# Patient Record
Sex: Male | Born: 1965 | Race: Black or African American | Hispanic: No | Marital: Single | Smoking: Current every day smoker
Health system: Southern US, Community
[De-identification: ages and names within clinical notes are randomized; demographics above are authoritative.]

---

## 2014-10-07 ENCOUNTER — Encounter (HOSPITAL_COMMUNITY): Payer: Self-pay | Admitting: Emergency Medicine

## 2014-10-07 ENCOUNTER — Emergency Department (HOSPITAL_COMMUNITY)
Admission: EM | Admit: 2014-10-07 | Discharge: 2014-10-07 | Disposition: A | Discharge status: Home / Self Care | Payer: Self-pay | Attending: Emergency Medicine | Admitting: Emergency Medicine

## 2014-10-07 DIAGNOSIS — N342 Other urethritis: Secondary | ICD-10-CM | POA: Insufficient documentation

## 2014-10-07 DIAGNOSIS — Z720 Tobacco use: Secondary | ICD-10-CM | POA: Insufficient documentation

## 2014-10-07 MED ORDER — AZITHROMYCIN 250 MG PO TABS
1000.0000 mg | ORAL_TABLET | Freq: Once | ORAL | Status: AC
  Administered 2014-10-07: 1000 mg via ORAL
  Filled 2014-10-07: qty 4

## 2014-10-07 MED ORDER — CEFTRIAXONE SODIUM 250 MG IJ SOLR
250.0000 mg | Freq: Once | INTRAMUSCULAR | Status: AC
  Administered 2014-10-07: 250 mg via INTRAMUSCULAR
  Filled 2014-10-07: qty 250

## 2014-10-07 MED ORDER — STERILE WATER FOR INJECTION IJ SOLN
1.0000 mL | Freq: Once | INTRAMUSCULAR | Status: AC
  Administered 2014-10-07: 1 mL via INTRAMUSCULAR

## 2014-10-07 MED ORDER — STERILE WATER FOR INJECTION IJ SOLN
INTRAMUSCULAR | Status: AC
  Administered 2014-10-07: 1 mL via INTRAMUSCULAR
  Filled 2014-10-07: qty 10

## 2014-10-07 NOTE — ED Notes (Signed)
Pt from home for eval of yellow discharge and dysuria that started after having intercourse with a woman on Saturday. Pt denies any n/v/d or abd pain. nad noted.

## 2014-10-07 NOTE — ED Provider Notes (Signed)
CSN: [id number]     Arrival date & time 10/07/14  1706 History  This chart was scribed for Steve Lozano, working with Mirian Mo, MD by Chestine Spore, ED Scribe. The patient was seen in room TR02C/TR02C at 5:49 PM.    Chief Complaint  Patient presents with  . Penile Discharge    The history is provided by the patient. No language interpreter was used.    Steve Lozano is a 49 y.o. male who presents to the Emergency Department complaining of yellow penile discharge onset yesterday. He reports that he last had unprotected intercourse with a woman 4 days ago. He notes that he was recently tested for HIV and the results returned negative. He denies medical hx of STD. He states that she is having associated symptoms of dysuria x 1 day. He states that he has not tried any medications for the relief of his symptoms. He denies penile pain/swelling, testicular pain/swelling, abdominal pain, n/v, sore throat, genital lesions, and any other symptoms. Denies allergies to any medications.    History reviewed. No pertinent past medical history. History reviewed. No pertinent past surgical history. No family history on file. Social History  Substance Use Topics  . Smoking status: Current Every Day Smoker -- 0.50 packs/day    Types: Cigarettes  . Smokeless tobacco: None  . Alcohol Use: No    Review of Systems  Constitutional: Negative for fever.  HENT: Negative for sore throat.   Eyes: Negative for discharge.  Gastrointestinal: Negative for nausea, vomiting, abdominal pain and rectal pain.  Genitourinary: Positive for dysuria and discharge (yellow). Negative for frequency, hematuria, penile swelling, scrotal swelling, difficulty urinating, genital sores, penile pain and testicular pain.  Musculoskeletal: Negative for arthralgias.  Skin: Negative for color change and rash.  Hematological: Negative for adenopathy.  Psychiatric/Behavioral: Negative for hallucinations.     Allergies  Review of  patient's allergies indicates no known allergies.  Home Medications   Prior to Admission medications   Not on File   BP 128/88 mmHg  Pulse 81  Temp(Src) 98 F (36.7 C) (Oral)  Ht  (1.727 m)  Wt 160 lb (72.576 kg)  BMI 24.33 kg/m2  SpO2 100%   Physical Exam  Constitutional: He appears well-developed and well-nourished.  HENT:  Head: Normocephalic and atraumatic.  Eyes: Conjunctivae are normal.  Neck: Normal range of motion. Neck supple.  Pulmonary/Chest: No respiratory distress.  Genitourinary: Uncircumcised. Penile tenderness present. Discharge (Copious yellow discharge) found.  Neurological: He is alert.  Skin: Skin is warm and dry.  Psychiatric: He has a normal mood and affect.  Nursing note and vitals reviewed.   ED Course  Procedures (including critical care time) DIAGNOSTIC STUDIES: Oxygen Saturation is 100% on RA, nl by my interpretation.    COORDINATION OF CARE: 5:54 PM Discussed treatment plan with pt at bedside which includes GC/Chylamydia probe, rocephin and Zithromax, HIV antibody, RPR and pt agreed to plan.   Labs Review Labs Reviewed - No data to display  Imaging Review No results found. I have personally reviewed and evaluated these lab results as part of my medical decision-making.   EKG Interpretation None      Vital signs reviewed and are as follows: Filed Vitals:   10/07/14 1728  BP: 128/88  Pulse: 81  Temp: 98 F (36.7 C)   Patient counseled on safe sexual practices. Told them that they should not have sexual contact for next 7 days and that they need to inform sexual partners so that  they can get tested and treated as well. Urged f/u with [place] [facility] for HIV and syphilis testing.  Patient verbalizes understanding and agrees with plan.    MDM   Final diagnoses:  Urethritis   Patient with likely gonococcal urethritis area testing and treatment as above. Do not suspect other complicating factors.  I personally  performed the services described in this documentation, which was scribed in my presence. The recorded information has been reviewed and is accurate.    Renne Crigler, PA-C 10/07/14 1840  Mirian Mo, MD 10/07/14 [phone]

## 2014-10-07 NOTE — Discharge Instructions (Signed)
Please read and follow all provided instructions.  Your diagnoses today include:  1. Urethritis     Tests performed today include:  Test for gonorrhea and chlamydia. You will be notified by telephone if you have a positive result.  Vital signs. See below for your results today.   Medications:  You were treated for chlamydia (1 gram azithromycin pills) and gonorrhea (  rocephin shot).  Home care instructions:  Read educational materials contained in this packet and follow any instructions provided.   You should tell your partners about your infection and avoid having sex for one week to allow time for the medicine to work.  STD Testing:  [facility] Department of [facility] [place], [facility]  [address], [place], phone [phone] or [phone]    Monday - Friday, call for an appointment  [facility] Department of [facility], [facility]  [address], [place], phone [phone] or [phone]   Monday - Friday, call for an appointment  Return instructions:   Please return to the Emergency Department if you experience worsening symptoms.   Please return if you have any other emergent concerns.  Additional Information:  Your vital signs today were: BP 128/88 mmHg   Pulse 81   Temp(Src) 98 F (36.7 C) (Oral)   Ht  (1.727 m)   Wt 160 lb (72.576 kg)   BMI 24.33 kg/m2   SpO2 100% If your blood pressure (BP) was elevated above 135/85 this visit, please have this repeated by your doctor within one month. --------------

## 2014-10-08 LAB — GC/CHLAMYDIA PROBE AMP (~~LOC~~) NOT AT ARMC
Chlamydia: NEGATIVE
Neisseria Gonorrhea: POSITIVE — AB

## 2014-10-08 LAB — HIV ANTIBODY (ROUTINE TESTING W REFLEX): HIV SCREEN 4TH GENERATION: NONREACTIVE

## 2014-10-08 LAB — RPR: RPR Ser Ql: NONREACTIVE

## 2014-10-09 ENCOUNTER — Telehealth (HOSPITAL_BASED_OUTPATIENT_CLINIC_OR_DEPARTMENT_OTHER): Payer: Self-pay | Admitting: Emergency Medicine

## 2016-03-05 ENCOUNTER — Encounter (HOSPITAL_BASED_OUTPATIENT_CLINIC_OR_DEPARTMENT_OTHER): Payer: Self-pay

## 2016-03-05 ENCOUNTER — Emergency Department (HOSPITAL_BASED_OUTPATIENT_CLINIC_OR_DEPARTMENT_OTHER)
Admission: EM | Admit: 2016-03-05 | Discharge: 2016-03-06 | Disposition: A | Discharge status: Home / Self Care | Payer: Self-pay | Attending: Emergency Medicine | Admitting: Emergency Medicine

## 2016-03-05 DIAGNOSIS — F17210 Nicotine dependence, cigarettes, uncomplicated: Secondary | ICD-10-CM | POA: Insufficient documentation

## 2016-03-05 DIAGNOSIS — B349 Viral infection, unspecified: Secondary | ICD-10-CM | POA: Insufficient documentation

## 2016-03-05 LAB — COMPREHENSIVE METABOLIC PANEL
ALBUMIN: 4.3 g/dL (ref 3.5–5.0)
ALK PHOS: 64 U/L (ref 38–126)
ALT: 42 U/L (ref 17–63)
AST: 68 U/L — AB (ref 15–41)
Anion gap: 9 (ref 5–15)
BUN: 15 mg/dL (ref 6–20)
CHLORIDE: 99 mmol/L — AB (ref 101–111)
CO2: 24 mmol/L (ref 22–32)
CREATININE: 1.31 mg/dL — AB (ref 0.61–1.24)
Calcium: 8.9 mg/dL (ref 8.9–10.3)
GFR calc Af Amer: >60 mL/min (ref >60)
GFR calc non Af Amer: >60 mL/min (ref >60)
GLUCOSE: 120 mg/dL — AB (ref 65–99)
Potassium: 3.4 mmol/L — ABNORMAL LOW (ref 3.5–5.1)
Sodium: 132 mmol/L — ABNORMAL LOW (ref 135–145)
Total Bilirubin: 0.9 mg/dL (ref 0.3–1.2)
Total Protein: 7.7 g/dL (ref 6.5–8.1)

## 2016-03-05 LAB — CBC WITH DIFFERENTIAL/PLATELET
BASOS ABS: 0 10*3/uL (ref 0.0–0.1)
Basophils Relative: 1 %
EOS ABS: 0 10*3/uL (ref 0.0–0.7)
EOS PCT: 0 %
HCT: 45.7 % (ref 39.0–52.0)
HEMOGLOBIN: 15.9 g/dL (ref 13.0–17.0)
Lymphocytes Relative: 12 %
Lymphs Abs: 0.8 10*3/uL (ref 0.7–4.0)
MCH: 31.9 pg (ref 26.0–34.0)
MCHC: 34.8 g/dL (ref 30.0–36.0)
MCV: 91.6 fL (ref 78.0–100.0)
Monocytes Absolute: 0.9 10*3/uL (ref 0.1–1.0)
Monocytes Relative: 14 %
NEUTROS PCT: 73 %
Neutro Abs: 4.6 10*3/uL (ref 1.7–7.7)
PLATELETS: 200 10*3/uL (ref 150–400)
RBC: 4.99 MIL/uL (ref 4.22–5.81)
RDW: 13 % (ref 11.5–15.5)
WBC: 6.4 10*3/uL (ref 4.0–10.5)

## 2016-03-05 LAB — LIPASE, BLOOD: Lipase: 19 U/L (ref 11–51)

## 2016-03-05 MED ORDER — ONDANSETRON HCL 4 MG/2ML IJ SOLN
4.0000 mg | Freq: Once | INTRAMUSCULAR | Status: AC
  Administered 2016-03-06: 4 mg via INTRAVENOUS
  Filled 2016-03-05: qty 2

## 2016-03-05 MED ORDER — ACETAMINOPHEN 325 MG PO TABS
650.0000 mg | ORAL_TABLET | Freq: Once | ORAL | Status: AC
  Administered 2016-03-05: 650 mg via ORAL
  Filled 2016-03-05: qty 2

## 2016-03-05 MED ORDER — SODIUM CHLORIDE 0.9 % IV BOLUS (SEPSIS)
1000.0000 mL | Freq: Once | INTRAVENOUS | Status: AC
  Administered 2016-03-05: 1000 mL via INTRAVENOUS

## 2016-03-05 NOTE — ED Triage Notes (Signed)
C/o vomiting x today-prod cough yesterday-NAD-steady gait

## 2016-03-05 NOTE — ED Notes (Signed)
Pt vomited x 2 today  1 time after drink oj,  At present denies any nausea

## 2016-03-05 NOTE — ED Provider Notes (Signed)
[facility] Note   CSN: [id number] Arrival date & time: 03/05/16  2247  By signing my name below, I, Modena JanskyAlbert Thayil, attest that this documentation has been prepared under the direction and in the presence of Lonzy Mato, MD. Electronically Signed: Modena JanskyAlbert Thayil, Scribe. 03/05/2016. 11:49 PM.  History   Chief Complaint Chief Complaint  Patient presents with  . Emesis   The history is provided by the patient. No language interpreter was used.  Emesis   This is a new problem. The problem occurs 2 to 4 times per day. The problem has not changed since onset.The emesis has an appearance of stomach contents. There has been no fever. Associated symptoms include cough and URI. Pertinent negatives include no abdominal pain, no diarrhea and no fever. Risk factors: unknown.   HPI Comments: Steve Lozano is a 51 y.o. male who presents to the Emergency Department complaining of intermittent vomiting that started today. He states he has been having gradually worsening URI-like symptoms. He has associated productive cough (onset yesterday). He took mucinex PTA with minimal relief. He denies any myalgias or other complaints.   History reviewed. No pertinent past medical history.  There are no active problems to display for this patient.   History reviewed. No pertinent surgical history.     Home Medications    Prior to Admission medications   Not on File    Family History No family history on file.  Social History Social History  Substance Use Topics  . Smoking status: Current Every Day Smoker    Packs/day: 0.50    Types: Cigarettes  . Smokeless tobacco: Never Used  . Alcohol use No     Allergies   Patient has no known allergies.   Review of Systems Review of Systems  Constitutional: Negative for diaphoresis, fatigue and fever.  HENT: Positive for congestion. Negative for drooling, trouble swallowing and voice change.   Respiratory: Positive for cough.  Negative for shortness of breath.   Cardiovascular: Negative for chest pain, palpitations and leg swelling.  Gastrointestinal: Positive for vomiting. Negative for abdominal pain and diarrhea.  Genitourinary: Negative for difficulty urinating.  All other systems reviewed and are negative.    Physical Exam Updated Vital Signs BP 110/83 (BP Location: Left Arm)   Pulse (!) 139   Temp 101.5 F (38.6 C) (Oral)   Resp 24   SpO2 95%   Physical Exam  Constitutional: He is oriented to person, place, and time. He appears well-developed and well-nourished. No distress.  HENT:  Head: Normocephalic and atraumatic.  Mouth/Throat: Mucous membranes are normal. Mucous membranes are not dry. No oropharyngeal exudate, posterior oropharyngeal edema or posterior oropharyngeal erythema. No tonsillar exudate.  Eyes: Conjunctivae are normal. Pupils are equal, round, and reactive to light.  Neck: Normal range of motion. Neck supple. No JVD present.  Cardiovascular: Normal rate and regular rhythm.   Pulmonary/Chest: Effort normal and breath sounds normal. No stridor. No respiratory distress. He has no wheezes. He has no rales.  Abdominal: Soft. Bowel sounds are normal. He exhibits no mass. There is no tenderness. There is no rebound and no guarding.  Musculoskeletal: Normal range of motion. He exhibits no edema, tenderness or deformity.  Lymphadenopathy:    He has no cervical adenopathy.  Neurological: He is alert and oriented to person, place, and time. He displays normal reflexes.  Skin: Skin is warm and dry. Capillary refill takes less than 2 seconds. He is not diaphoretic.  Psychiatric: He has a normal  mood and affect.  Nursing note and vitals reviewed.    ED Treatments / Results   Vitals:   03/05/16 2252 03/06/16 0020  BP: 110/83 114/75  Pulse: (!) 139 111  Resp: 24 20  Temp: 101.5 F (38.6 C) 101 F (38.3 C)    DIAGNOSTIC STUDIES: Oxygen Saturation is 95% on RA, normal by my  interpretation.    COORDINATION OF CARE: 11:53 PM- Pt advised of plan for treatment and pt agrees.  Results for orders placed or performed during the hospital encounter of 03/05/16  CBC with Differential  Result Value Ref Range   WBC 6.4 4.0 - 10.5 K/uL   RBC 4.99 4.22 - 5.81 MIL/uL   Hemoglobin 15.9 13.0 - 17.0 g/dL   HCT 29.5 62.1 - 30.8 %   MCV 91.6 78.0 - 100.0 fL   MCH 31.9 26.0 - 34.0 pg   MCHC 34.8 30.0 - 36.0 g/dL   RDW 65.7 84.6 - 96.2 %   Platelets 200 150 - 400 K/uL   Neutrophils Relative % 73 %   Neutro Abs 4.6 1.7 - 7.7 K/uL   Lymphocytes Relative 12 %   Lymphs Abs 0.8 0.7 - 4.0 K/uL   Monocytes Relative 14 %   Monocytes Absolute 0.9 0.1 - 1.0 K/uL   Eosinophils Relative 0 %   Eosinophils Absolute 0.0 0.0 - 0.7 K/uL   Basophils Relative 1 %   Basophils Absolute 0.0 0.0 - 0.1 K/uL  Comprehensive metabolic panel  Result Value Ref Range   Sodium 132 (L) 135 - 145 mmol/L   Potassium 3.4 (L) 3.5 - 5.1 mmol/L   Chloride 99 (L) 101 - 111 mmol/L   CO2 24 22 - 32 mmol/L   Glucose, Bld 120 (H) 65 - 99 mg/dL   BUN 15 6 - 20 mg/dL   Creatinine, Ser 9.52 (H) 0.61 - 1.24 mg/dL   Calcium 8.9 8.9 - 84.1 mg/dL   Total Protein 7.7 6.5 - 8.1 g/dL   Albumin 4.3 3.5 - 5.0 g/dL   AST 68 (H) 15 - 41 U/L   ALT 42 17 - 63 U/L   Alkaline Phosphatase 64 38 - 126 U/L   Total Bilirubin 0.9 0.3 - 1.2 mg/dL   GFR calc non Af Amer >60 >60 mL/min   GFR calc Af Amer >60 >60 mL/min   Anion gap 9 5 - 15  Lipase, blood  Result Value Ref Range   Lipase 19 11 - 51 U/L   Dg Chest 2 View  Result Date: 03/06/2016 CLINICAL DATA:  Productive cough EXAM: CHEST  2 VIEW COMPARISON:  None. FINDINGS: The heart size and mediastinal contours are within normal limits. Both lungs are clear. Slight reversal of thoracic curvature on the lateral view. IMPRESSION: No active cardiopulmonary disease. Electronically Signed   By: Tollie Eth M.D.   On: 03/06/2016 01:14   Procedures Procedures (including  critical care time)  Medications Ordered in ED Medications  sodium chloride 0.9 % bolus 1,000 mL (1,000 mLs Intravenous New Bag/Given 03/05/16 2309)  ondansetron (ZOFRAN) injection 4 mg (0 mg Intravenous Hold 03/05/16 2309)  acetaminophen (TYLENOL) tablet 650 mg (650 mg Oral Given 03/05/16 2258)       Final Clinical Impressions(s) / ED Diagnoses  Viral illness:  Alternate tylenol and ibuprofen for fever.  Zofran for vomiting.  Strict return precautions for weakness, shortness of breath, prolonged cough,  Chest pain, inability to tolerate oral meds or liquids or any concerns.   All questions  answered to patient's satisfaction. Based on history and exam patient has been appropriately medically screened and emergency conditions excluded. Patient is stable for discharge at this time.  New Prescriptions New Prescriptions   No medications on file   I personally performed the services described in this documentation, which was scribed in my presence. The recorded information has been reviewed and is accurate.       Cy Blamer, MD 03/06/16 1610

## 2016-03-06 ENCOUNTER — Encounter (HOSPITAL_BASED_OUTPATIENT_CLINIC_OR_DEPARTMENT_OTHER): Payer: Self-pay | Admitting: Emergency Medicine

## 2016-03-06 ENCOUNTER — Emergency Department (HOSPITAL_BASED_OUTPATIENT_CLINIC_OR_DEPARTMENT_OTHER): Payer: Self-pay

## 2016-03-06 MED ORDER — ONDANSETRON 8 MG PO TBDP: ORAL_TABLET | ORAL | 0 refills | Status: AC

## 2016-03-06 MED ORDER — FLUTICASONE PROPIONATE 50 MCG/ACT NA SUSP: 2.0000 | Freq: Every day | NASAL | 0 refills | Status: AC

## 2018-01-20 IMAGING — DX DG CHEST 2V
2 series · 2 of 2 positions shown · non-contrast
Comparison: None.

CLINICAL DATA: Productive cough

EXAM:
CHEST  2 VIEW

[chest pa]
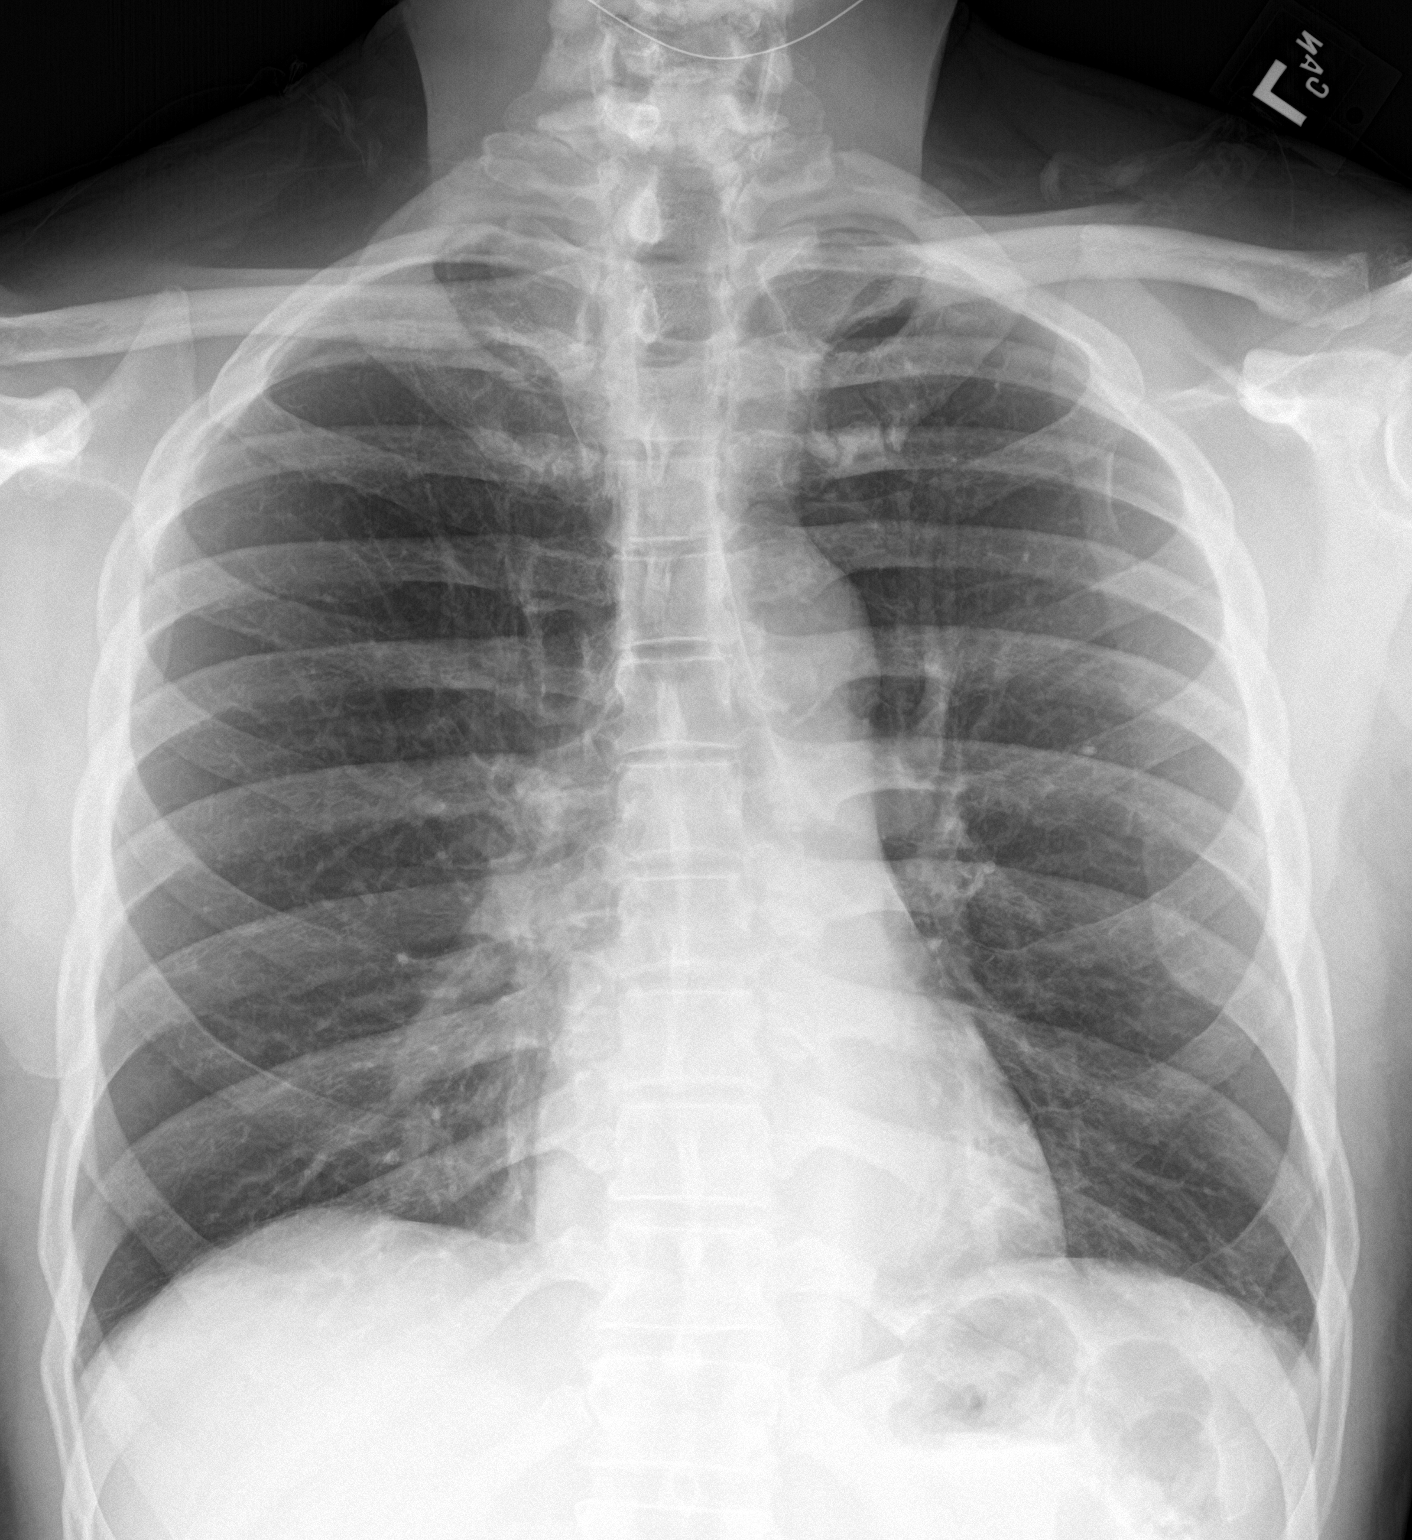

[chest lat]
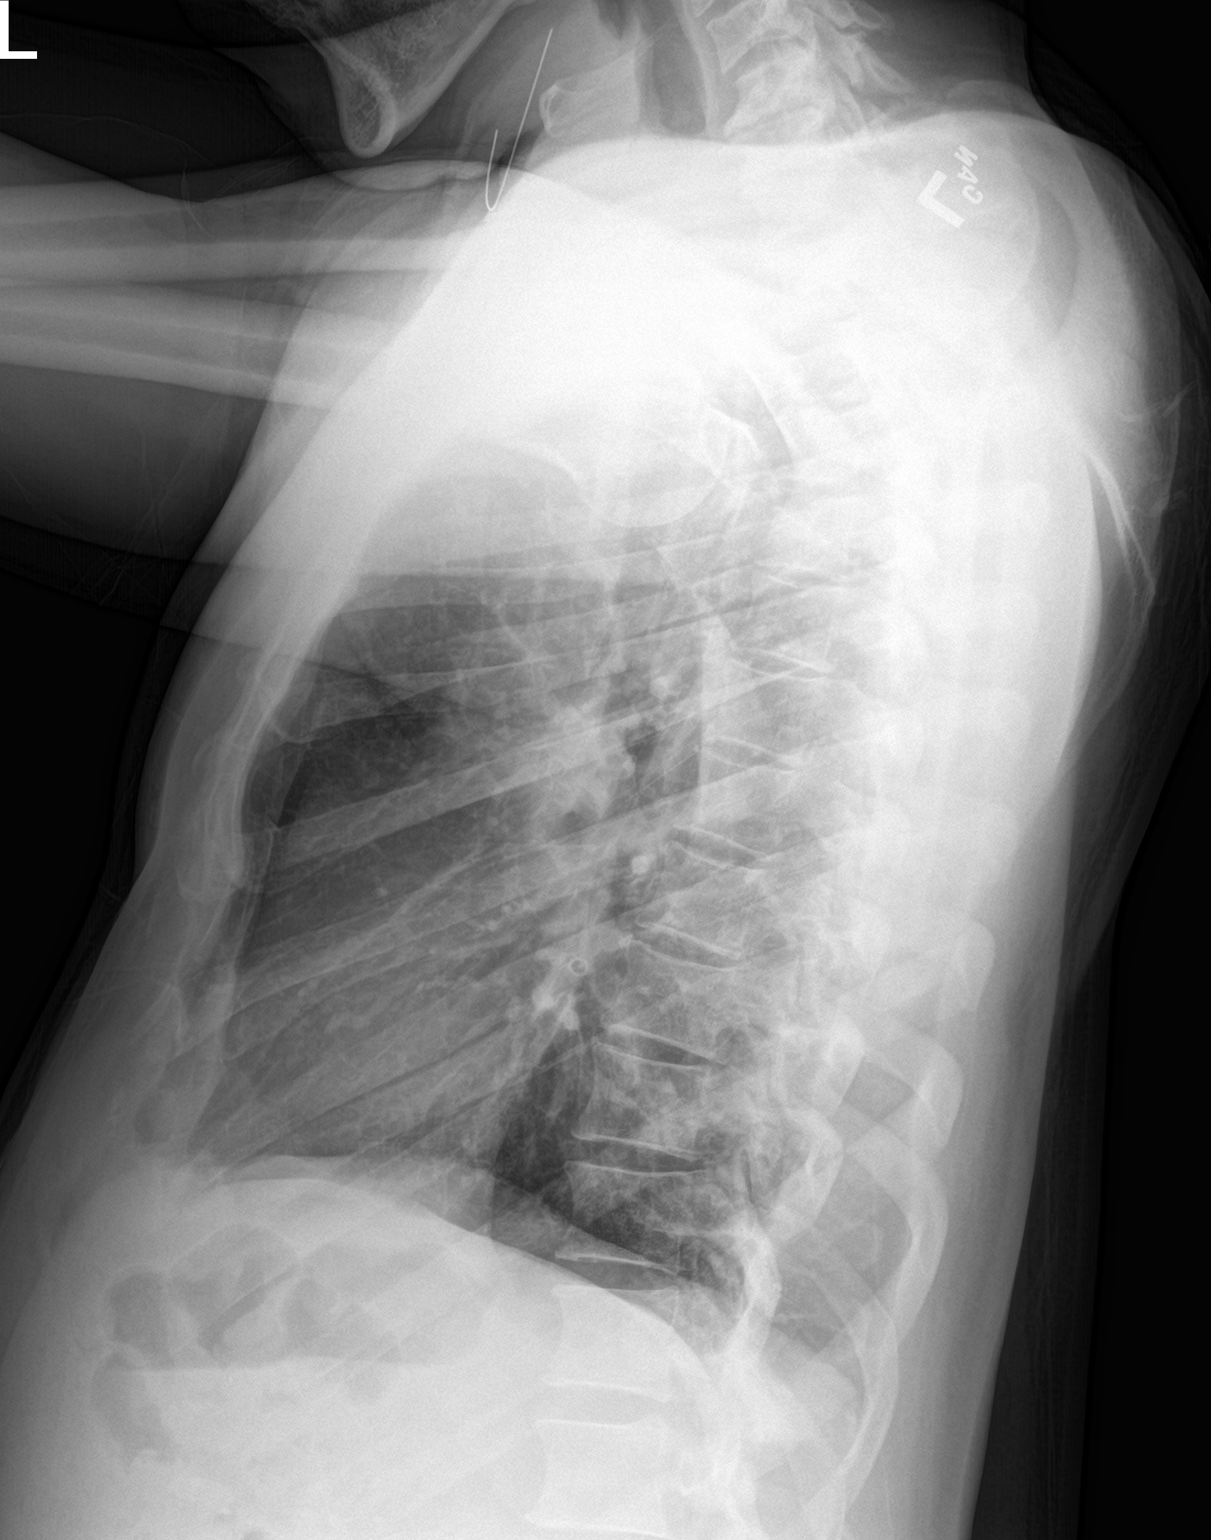

[2 of 2 positions shown; findings below may reference images not displayed]

FINDINGS: The heart size and mediastinal contours are within normal limits.
Both lungs are clear. Slight reversal of thoracic curvature on the
lateral view.
IMPRESSION: No active cardiopulmonary disease.

## 2019-05-06 ENCOUNTER — Other Ambulatory Visit: Payer: Self-pay

## 2019-05-06 ENCOUNTER — Emergency Department (HOSPITAL_BASED_OUTPATIENT_CLINIC_OR_DEPARTMENT_OTHER)
Admission: EM | Admit: 2019-05-06 | Discharge: 2019-05-06 | Disposition: A | Discharge status: Home / Self Care | Payer: Self-pay | Attending: Emergency Medicine | Admitting: Emergency Medicine

## 2019-05-06 ENCOUNTER — Emergency Department (HOSPITAL_BASED_OUTPATIENT_CLINIC_OR_DEPARTMENT_OTHER): Payer: Self-pay

## 2019-05-06 ENCOUNTER — Encounter (HOSPITAL_BASED_OUTPATIENT_CLINIC_OR_DEPARTMENT_OTHER): Payer: Self-pay | Admitting: Emergency Medicine

## 2019-05-06 DIAGNOSIS — M25561 Pain in right knee: Secondary | ICD-10-CM | POA: Insufficient documentation

## 2019-05-06 DIAGNOSIS — F17210 Nicotine dependence, cigarettes, uncomplicated: Secondary | ICD-10-CM | POA: Insufficient documentation

## 2019-05-06 DIAGNOSIS — Z79899 Other long term (current) drug therapy: Secondary | ICD-10-CM | POA: Insufficient documentation

## 2019-05-06 MED ORDER — IBUPROFEN 800 MG PO TABS
800.0000 mg | ORAL_TABLET | Freq: Once | ORAL | Status: AC
  Administered 2019-05-06: 800 mg via ORAL
  Filled 2019-05-06: qty 1

## 2019-05-06 NOTE — Discharge Instructions (Addendum)
No fracture is seen on your x-ray.  Suspect arthritis causing swelling and pain.  Recommend 600 mg of Motrin 3 times a day for the next 5 days and then as needed.  Recommend 1000 mg of Tylenol 4 times a day for the next 5 days and then as needed.  Ice your knee.

## 2019-05-06 NOTE — ED Provider Notes (Signed)
Steve Lozano Provider Note   CSN: [id number] Arrival date & time: 05/06/19  0831     History Chief Complaint  Patient presents with  . Knee Pain    Steve Lozano is a 54 y.o. male.  The history is provided by the patient.  Knee Pain Location:  Knee Knee location:  R knee Pain details:    Quality:  Aching   Radiates to:  Does not radiate   Severity:  Mild   Onset quality:  Gradual   Timing:  Intermittent   Progression:  Waxing and waning Chronicity:  New Relieved by:  Nothing Worsened by:  Bearing weight and activity Ineffective treatments:  None tried Associated symptoms: swelling   Associated symptoms: no back pain, no decreased ROM, no fatigue, no fever, no itching, no muscle weakness, no neck pain, no numbness, no stiffness and no tingling        History reviewed. No pertinent past medical history.  There are no problems to display for this patient.   History reviewed. No pertinent surgical history.     No family history on file.  Social History   Tobacco Use  . Smoking status: Current Every Day Smoker    Packs/day: 0.50    Types: Cigarettes  . Smokeless tobacco: Never Used  Substance Use Topics  . Alcohol use: No  . Drug use: No    Home Medications Prior to Admission medications   Medication Sig Start Date End Date Taking? Authorizing Provider  fluticasone (FLONASE) 50 MCG/ACT nasal spray Place 2 sprays into both nostrils daily. 03/06/16   Palumbo, April, MD  ondansetron (ZOFRAN ODT) 8 MG disintegrating tablet 8mg  ODT q8 hours prn nausea 03/06/16   Palumbo, April, MD    Allergies    Patient has no known allergies.  Review of Systems   Review of Systems  Constitutional: Negative for fatigue and fever.  Musculoskeletal: Positive for arthralgias and joint swelling. Negative for back pain, gait problem, myalgias, neck pain, neck stiffness and stiffness.  Skin: Negative for color change, itching, pallor, rash and  wound.  Neurological: Negative for weakness and numbness.    Physical Exam Updated Vital Signs BP (!) 150/84 (BP Location: Right Arm)   Pulse 99   Temp 97.7 F (36.5 C) (Oral)   Resp 18   Ht 5\' 6"  (1.676 m)   Wt 73 kg   SpO2 100%   BMI 25.99 kg/m   Physical Exam Constitutional:      General: He is not in acute distress.    Appearance: He is not ill-appearing.  Musculoskeletal:        General: Swelling (suprapatellar area to right knee) and tenderness (right knee) present. Normal range of motion.  Skin:    General: Skin is warm.     Findings: No erythema.  Neurological:     Mental Status: He is alert.     Sensory: No sensory deficit.     Motor: No weakness.     ED Results / Procedures / Treatments   Labs (all labs ordered are listed, but only abnormal results are displayed) Labs Reviewed - No data to display  EKG None  Radiology DG Knee Complete 4 Views Right  Result Date: 05/06/2019 CLINICAL DATA:  Pain and swelling in the right patella for 1 month. No reported injury. EXAM: RIGHT KNEE - COMPLETE 4+ VIEW COMPARISON:  None. FINDINGS: No fracture, significant joint effusion or dislocation. No focal osseous lesions. Moderate tricompartmental right knee osteoarthritis. No  radiopaque foreign bodies. IMPRESSION: Moderate tricompartmental right knee osteoarthritis. No acute osseous abnormality. Electronically Signed   By: Delbert Phenix M.D.   On: 05/06/2019 09:49    Procedures Procedures (including critical care time)  Medications Ordered in ED Medications  ibuprofen (ADVIL) tablet 800 mg (800 mg Oral Given 05/06/19 9509)    ED Course  I have reviewed the triage vital signs and the nursing notes.  Pertinent labs & imaging results that were available during my care of the patient were reviewed by me and considered in my medical decision making (see chart for details).    MDM Rules/Calculators/A&P                      Steve Lozano is a 54 year old male who  presents to the ED with right knee pain and swelling for the last weeks.  Normal vitals.  No fever.  No history of severe trauma.  Has some mild swelling to the right knee.  Good range of motion.  No warmth erythema.  No fever.  No concern for septic joint.  X-ray shows some arthritic changes.  No fracture.  Suspect tendinitis versus some arthritis.  Has been doing increased yard work recently.  Recommend Tylenol Motrin ice and rest.  Discharged in ED in good condition.  Understands return precautions.  Recommend follow-up with primary care doctor.  This chart was dictated using voice recognition software.  Despite best efforts to proofread,  errors can occur which can change the documentation meaning.   Final Clinical Impression(s) / ED Diagnoses Final diagnoses:  Acute pain of right knee    Rx / DC Orders ED Discharge Orders    None       Virgina Norfolk, DO 05/06/19 [phone]

## 2019-05-06 NOTE — ED Triage Notes (Signed)
R knee pain and swelling for a few weeks. Denies injury.

## 2021-03-21 IMAGING — DX DG KNEE COMPLETE 4+V*R*
4 series · 4 of 4 positions shown · non-contrast
Comparison: None.

CLINICAL DATA: Pain and swelling in the right patella for 1 month.
No reported injury.

EXAM:
RIGHT KNEE - COMPLETE 4+ VIEW

[knee ap]
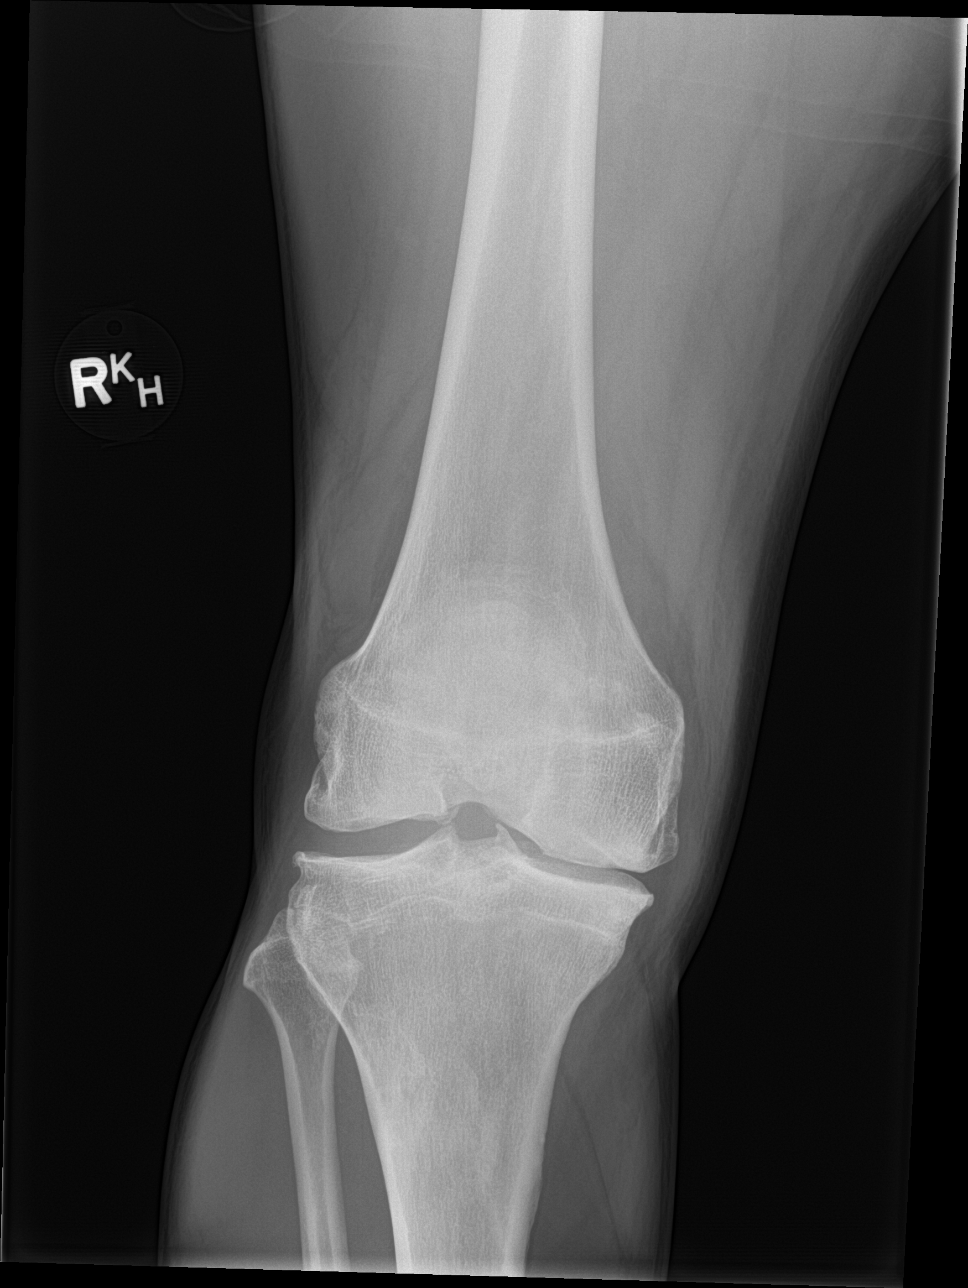

[knee lat]
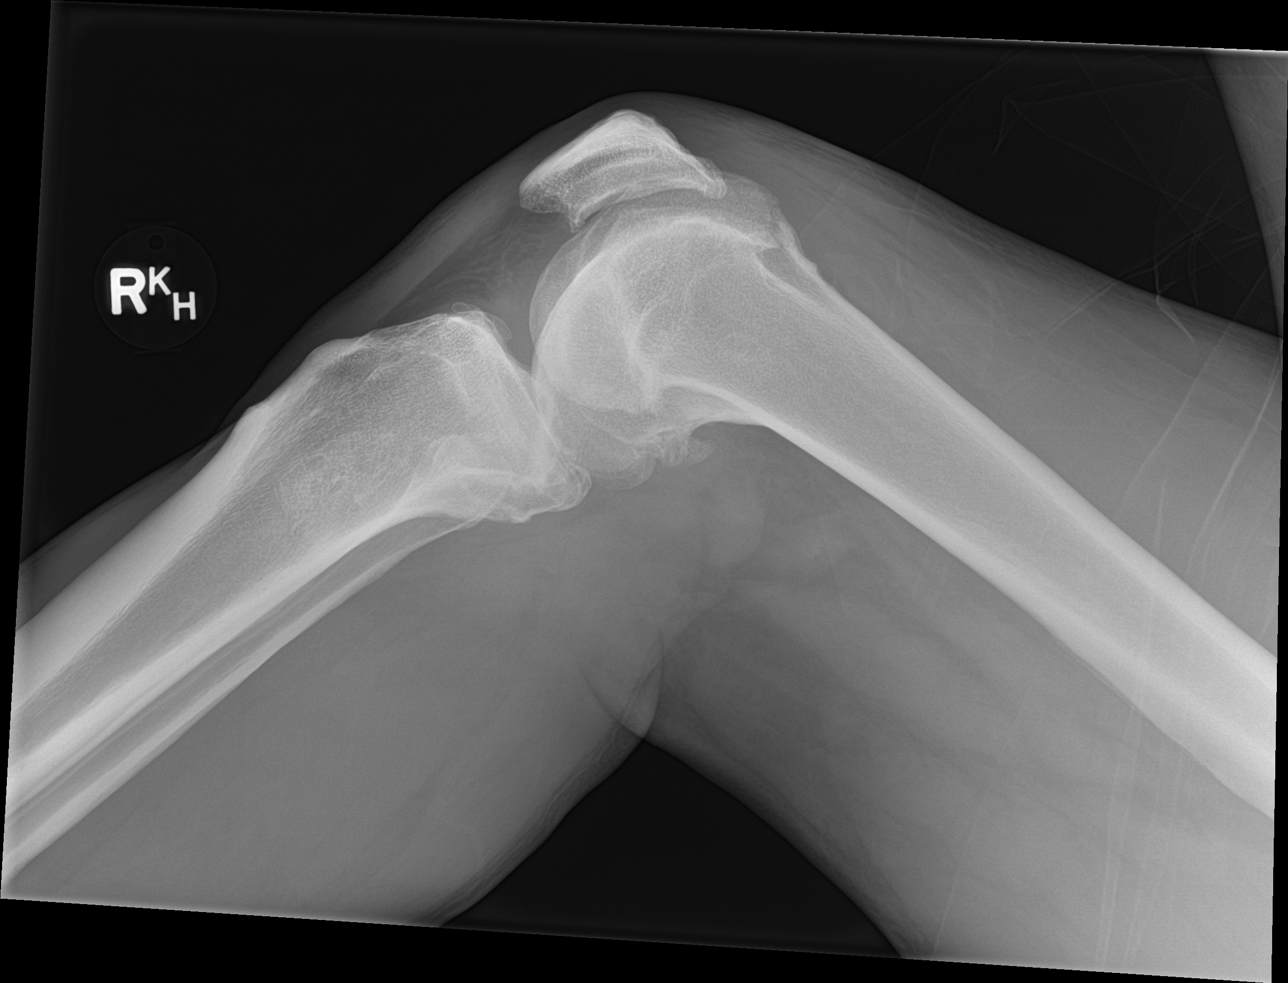

[knee obl (1 of 2)]
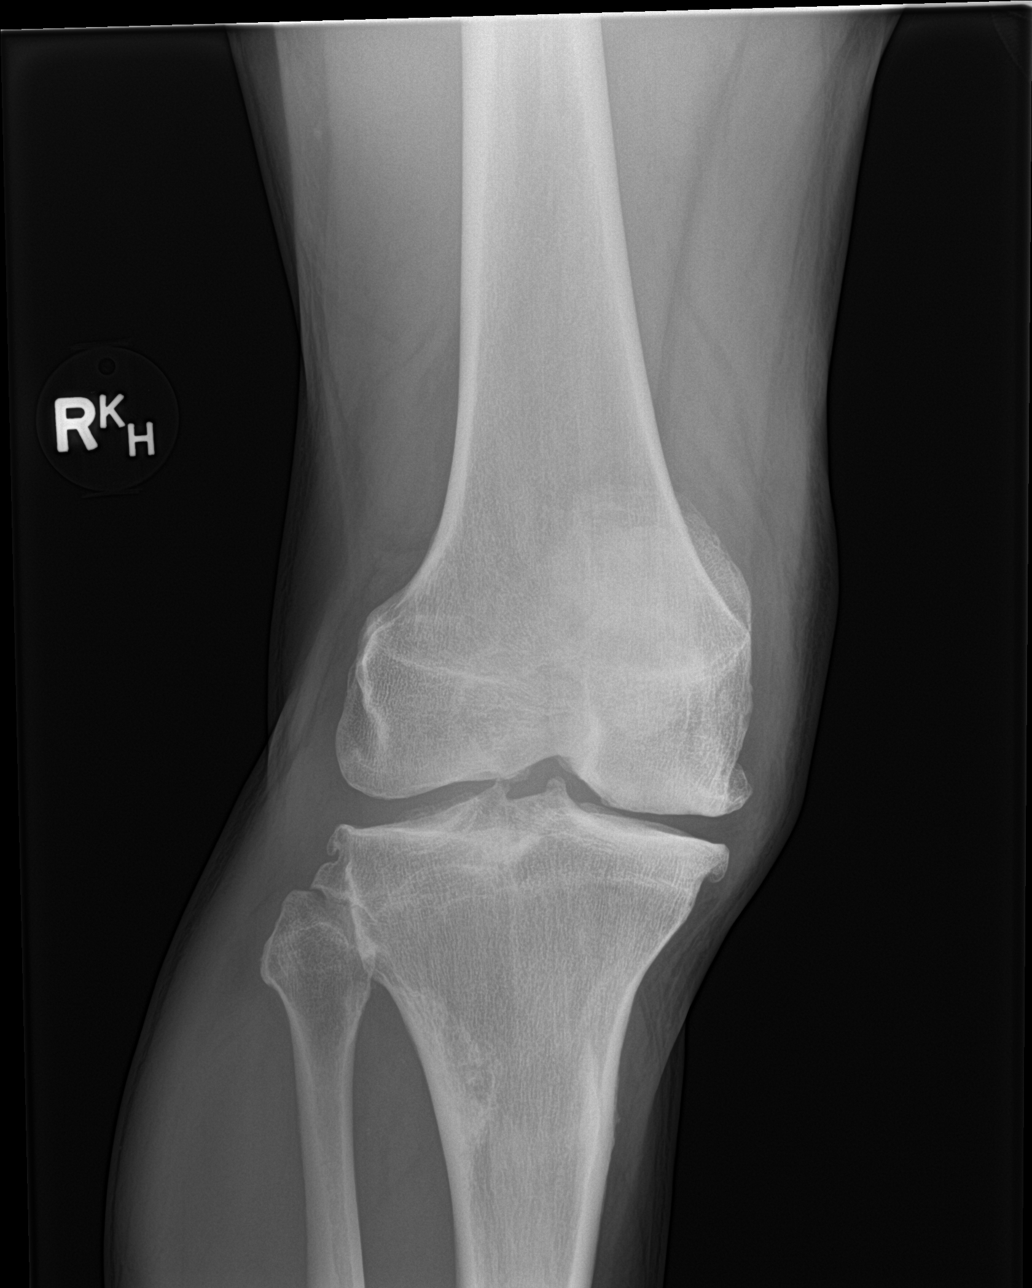

[knee obl (2 of 2)]
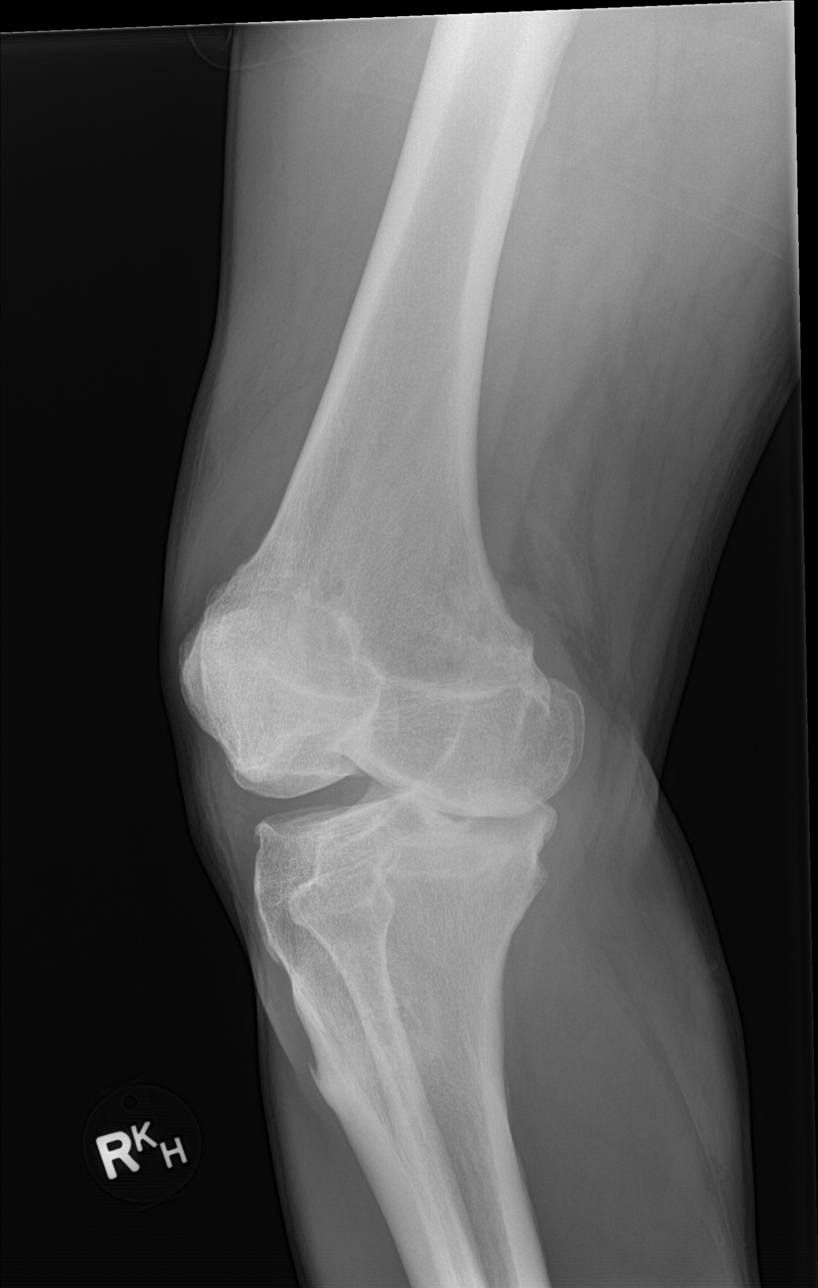

[4 of 4 positions shown; findings below may reference images not displayed]

FINDINGS: No fracture, significant joint effusion or dislocation. No focal
osseous lesions. Moderate tricompartmental right knee
osteoarthritis. No radiopaque foreign bodies.
IMPRESSION: Moderate tricompartmental right knee osteoarthritis. No acute
osseous abnormality.
